# Patient Record
Sex: Female | Born: 1990 | Race: White | Hispanic: No | Marital: Single | Smoking: Never smoker
Health system: Southern US, Community
[De-identification: ages and names within clinical notes are randomized; demographics above are authoritative.]

## PROBLEM LIST (undated history)

## (undated) DIAGNOSIS — F32A Depression, unspecified: Secondary | ICD-10-CM

## (undated) DIAGNOSIS — F329 Major depressive disorder, single episode, unspecified: Secondary | ICD-10-CM

## (undated) DIAGNOSIS — F419 Anxiety disorder, unspecified: Secondary | ICD-10-CM

## (undated) HISTORY — DX: Depression, unspecified: F32.A

## (undated) HISTORY — DX: Anxiety disorder, unspecified: F41.9

## (undated) HISTORY — DX: Major depressive disorder, single episode, unspecified: F32.9

---

## 2014-06-20 ENCOUNTER — Emergency Department: Payer: Self-pay | Admitting: Emergency Medicine

## 2015-06-04 ENCOUNTER — Ambulatory Visit (INDEPENDENT_AMBULATORY_CARE_PROVIDER_SITE_OTHER): Payer: Managed Care, Other (non HMO) | Admitting: Licensed Clinical Social Worker

## 2015-06-04 DIAGNOSIS — F411 Generalized anxiety disorder: Secondary | ICD-10-CM

## 2015-06-04 DIAGNOSIS — F331 Major depressive disorder, recurrent, moderate: Secondary | ICD-10-CM
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
## 2015-06-28 ENCOUNTER — Encounter: Payer: Self-pay | Admitting: Psychiatry

## 2015-06-28 ENCOUNTER — Ambulatory Visit (INDEPENDENT_AMBULATORY_CARE_PROVIDER_SITE_OTHER): Payer: Managed Care, Other (non HMO) | Admitting: Psychiatry

## 2015-06-28 VITALS — BP 110/82 | HR 91 | Temp 98.0°F | Ht 71.0 in | Wt 223.4 lb

## 2015-06-28 DIAGNOSIS — F331 Major depressive disorder, recurrent, moderate: Secondary | ICD-10-CM

## 2015-06-28 DIAGNOSIS — F411 Generalized anxiety disorder: Secondary | ICD-10-CM

## 2015-06-28 MED ORDER — ESCITALOPRAM OXALATE 10 MG PO TABS: ORAL_TABLET | ORAL | Status: DC

## 2015-06-28 MED ORDER — CLONAZEPAM 0.5 MG PO TABS: 0.5000 mg | ORAL_TABLET | Freq: Two times a day (BID) | ORAL | Status: DC | PRN

## 2015-06-28 MED ORDER — TRAZODONE HCL 50 MG PO TABS: 50.0000 mg | ORAL_TABLET | Freq: Every evening | ORAL | Status: DC | PRN

## 2015-06-28 NOTE — Progress Notes (Signed)
Psychiatric Initial Adult Assessment   Patient Identification: Tonya Cooper MRN:  [id number] Date of Evaluation:  06/28/2015 Referral Source: OB/GYN/counselor Chief Complaint:  "I had a lot of trouble with depression in high school." I feel like I "lose control." Chief Complaint    Establish Care; Anxiety; Depression; Stress; Fatigue     Visit Diagnosis:    ICD-9-CM ICD-10-CM   1. Generalized anxiety disorder 300.02 F41.1   2. Moderate episode of recurrent major depressive disorder (HCC) 296.32 F33.1    Diagnosis:  There are no active problems to display for this patient.  History of Present Illness:  Patient indicates that since May 2016 she's had anxiety, feels like she is going to lose control, has difficulty accomplishing things and can't do the things she needs to do at work. She states she also is constantly worried that she is been to do something wrong. For example she states that if a coworker supervisor asked to speak with her she is fearful about what she has done wrong. She states that her mind typically is constantly worried about things. In regards to physical symptoms she states sometimes anxiety disappointed when she has tunnel vision, chest tightness and shaking. She states these physical symptoms can last 2 minutes. States typically her anxieties builds up and then she has the physical symptoms. She states that she sometimes then has guilt about what she has not been able to do or that she has not done something as well.  She relates she's had depression in the past but states that now it's more guilt about not being out to get the things she needs to be done. She states occasionally she does have moments of depression. For example she states she might him home from work in cry for 15 minutes or perhaps an hour. However she states presently she does not feel like her depression is holding her back is much as the anxiety issues discussed above. She states that her most  significant depression was during her senior year of high school in which she states her sleep was poor and she was experiencing sadness. She can't recall other symptoms of depression such as any disturbances in her appetite. She does state that he got to the point where she felt her depression was so painful that she did engage in cutting for about 2 weeks. She states that the cutting was not with the purpose of ending her life but she states that the physical pain she created would distract her from her depression and emotional pain. She states that she ended up seeing a counselor for about 9 months during that senior year but never started any medications for it. She denies suicidal ideation in the past or presently and denies any past suicide attempts.   Elements:  Duration:  As discussed above. Associated Signs/Symptoms: Depression Symptoms:  depressed mood, feelings of worthlessness/guilt, (Hypo) Manic Symptoms:  None Anxiety Symptoms:  Excessive Worry, Psychotic Symptoms:  None PTSD Symptoms: Negative  Past Medical History:  Past Medical History  Diagnosis Date  . Anxiety   . Depression    History reviewed. No pertinent past surgical history. Family History:  Family History  Problem Relation Age of Onset  . Anemia Mother   . Heart attack Mother   . Skin cancer Father    Social History:   Social History   Social History  . Marital Status: Single    Spouse Name: N/A  . Number of Children: N/A  . Years of Education:  N/A   Social History Main Topics  . Smoking status: Never Smoker   . Smokeless tobacco: Never Used  . Alcohol Use: 1.2 - 2.4 oz/week    0 Standard drinks or equivalent, 1-2 Glasses of wine, 1-2 Cans of beer, 0 Shots of liquor per week  . Drug Use: No  . Sexual Activity: Yes    Birth Control/ Protection: Pill, Condom   Other Topics Concern  . None   Social History Narrative  . None   Additional Social History: Patient indicates that her childhood was  good in terms of resources. However she states there was an ongoing theme of feeling her mother was not as caring as patient would've liked her to be. However patient explains that her mother was an executive and corporations in the travel a lot for her job and implied that the demands of the job were part of the reason that her mother was absent a lot of the time. Patient states she struggles with realizing that her mother was doing what she needed for career but states she also felt very bad growing up because she would observe other people with their mothers around them. She states that her father was around but describes them as lacking emotions. She has a younger brother who she described as a difficult person growing up but notes that he is improved after he went to college.  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 She lives with 2 female roommates. She states the roommates are going to move out in December. She states she has mixed emotions because she does look forward to having her privacy but then states that she also does not want to be alone.  Musculoskeletal: Strength & Muscle Tone: within normal limits Gait & Station: normal Patient leans: N/A  Psychiatric Specialty Exam: HPI  Review of Systems  Psychiatric/Behavioral: Positive for depression. Negative for suicidal ideas, hallucinations, memory loss and substance abuse. The patient is nervous/anxious and has insomnia.   All other systems reviewed and are negative.   Blood pressure 110/82, pulse 91, temperature 98 F (36.7 C), temperature source Tympanic, height  (1.803 m), weight 223 lb 6.4 oz (101.334 kg), last menstrual period 05/12/2015, SpO2 99 %.Body mass index is 31.17 kg/(m^2).  General Appearance: Well Groomed  Eye Contact:  Good  Speech:  Normal Rate  Volume:  Normal  Mood:  Anxious  Affect:  anxious, tearful at times when discussing issues  such as her mother or her anxiety symptoms, able to smile that is when discussing an upcoming trip this weekend to [place]  Thought Process:  Linear  Orientation:  Full (Time, Place, and Person)  Thought Content:  Negative  Suicidal Thoughts:  No  Homicidal Thoughts:  No  Memory:  Immediate;   Good Recent;   Good Remote;   Good  Judgement:  Good  Insight:  Good  Psychomotor Activity:  Negative  Concentration:  Good  Recall:  Good  Fund of Knowledge:Good  Language: Good  Akathisia:  Negative  Handed:   AIMS (if indicated):    Assets:  Communication Skills Desire for Improvement Housing Physical Health Vocational/Educational  ADL's:  Intact  Cognition: WNL  Sleep:  poor   Is the patient at risk to self?  No. Has the patient been a risk to self in the past 6 months?  No. Has the patient been a risk to self within the  distant past?  Yes.   cutting for a 2 week period during senior year in high school Is the patient a risk to others?  No. Has the patient been a risk to others in the past 6 months?  No. Has the patient been a risk to others within the distant past?  No.  Allergies:  No Known Allergies Current Medications: Current Outpatient Prescriptions  Medication Sig Dispense Refill  . MINASTRIN 24 FE 1-20 MG-MCG(24) CHEW     . clonazePAM (KLONOPIN) 0.5 MG tablet Take 1 tablet (0.5 mg total) by mouth 2 (two) times daily as needed for anxiety. One in morning and one in afternoon as needed. 60 tablet 1  . escitalopram (LEXAPRO) 10 MG tablet Take one half a tablet in the morning for seven days the increase to one whole tablet in the morning. 30 tablet 1  . traZODone (DESYREL) 50 MG tablet Take 1 tablet (50 mg total) by mouth at bedtime as needed for sleep. 30 tablet 1   No current facility-administered medications for this visit.    Previous Psychotropic Medications: No  Patient has tried some over-the-counter medications for sleep, melatonin, ZzzQuil and  Unisom Substance Abuse History in the last 12 months:  No. Patient states that there was a 2 month period where she drank heavily after the end of her relationship in 2014. Patient does not smoke cigarettes. She states currently her drinking habits are 2 drinks on the weekends and occasionally one glass of wine with dinner. Patient denies any use of illicit drugs. Consequences of Substance Abuse: Negative  Medical Decision Making:  New Problem, with no additional work-up planned (3), Review of Medication Regimen & Side Effects (2) and Review of New Medication or Change in Dosage (2)  Treatment Plan Summary: Medication management and Plan   Generalized anxiety disorder-we will start some Lexapro 5 mg in the morning for 7 days and then she will increase to 10 mg in the morning. Clonazepam 0.5 mg twice a day as needed for anxiety. Risk and benefits of both medications have been discussed patient's able consent.   Major depressive disorder, recurrent moderate-patient reports perhaps some depressed mood that may last hours during the day, some guilt surrounding being able to perform in the midst of anxiety symptoms. She does have a past history of having a major depressive episode. Lexapro as above. It does not appear that depression is the predominant issue at this time is much as the anxiety discussed above.  Insomnia-patient states she's been trying over-the-counter medications consisting of Unisom, ZzzQuil and melatonin. She states she has not received any benefit from those medications yet. She will take trazodone 50 at bedtime as needed for sleep risk and benefits have been discussing patient's able to consent. In regards to risk assessment the patient does have anxiety, depression, age and race as risk factors. She has protective factors of no past suicide attempts, forward thinking, employed, gauge in treatment and gender as protective factors. At this time low risk of imminent harm to self or  others.    Wallace Going 11/11/201611:55 AM

## 2015-07-08 ENCOUNTER — Ambulatory Visit (INDEPENDENT_AMBULATORY_CARE_PROVIDER_SITE_OTHER): Payer: Managed Care, Other (non HMO) | Admitting: Licensed Clinical Social Worker

## 2015-07-08 DIAGNOSIS — F411 Generalized anxiety disorder: Secondary | ICD-10-CM

## 2015-07-16 NOTE — Progress Notes (Signed)
   THERAPIST PROGRESS NOTE  Session Time: 60min  Participation Level: Active  Behavioral Response: Well GroomedAlertAnxious  Type of Therapy: Individual Therapy  Treatment Goals addressed: Coping  Interventions: CBT, Motivational Interviewing, Solution Focused, Strength-based, Supportive, Family Systems and Reframing  Summary: Tonya Cooper is a 24 y.o. female who presents with continued symptoms of her diagnosis.  She is currently struggling with the presence of her uncle whom was adopted by another family when he was a baby.  He has resurfaced and Patient is having anxiety and stress; she wants to feel and be accepted by him.  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  Patient was given coping skills and will utilize during the holiday.   Suicidal/Homicidal: Nowithout intent/plan  Therapist Response: LCSW provided Patient with ongoing emotional support and encouragement.  Normalized her feelings.  Commended Patient on her progress and reinforced the importance of client staying focused on her own strengths and resources and resiliency. Processed various strategies for dealing with stressors.    Plan: Return again in 2 weeks.  Diagnosis: Axis I: Generalized Anxiety Disorder    Axis II: No diagnosis    Marinda Elkicole M Peacock 07/16/2015

## 2015-07-25 ENCOUNTER — Ambulatory Visit (INDEPENDENT_AMBULATORY_CARE_PROVIDER_SITE_OTHER): Payer: Managed Care, Other (non HMO) | Admitting: Psychiatry

## 2015-07-25 ENCOUNTER — Encounter: Payer: Self-pay | Admitting: Psychiatry

## 2015-07-25 VITALS — BP 122/78 | HR 77 | Temp 98.2°F | Ht 71.0 in | Wt 228.4 lb

## 2015-07-25 DIAGNOSIS — F331 Major depressive disorder, recurrent, moderate: Secondary | ICD-10-CM

## 2015-07-25 DIAGNOSIS — F411 Generalized anxiety disorder: Secondary | ICD-10-CM

## 2015-07-25 MED ORDER — ESCITALOPRAM OXALATE 20 MG PO TABS: 20.0000 mg | ORAL_TABLET | Freq: Every day | ORAL | Status: DC

## 2015-07-25 NOTE — Progress Notes (Signed)
BH MD/PA/NP OP Progress Note  07/25/2015 4:14 PM Tonya Cooper  MRN:  [id number]  Subjective:  Patient returns follow-up of her generalized anxiety disorder, major depressive disorder, recurrent moderate and insomnia. She states she feels like she's noticed some improvement in her anxiety. She states that in the past where she feels like things would get to her to the point where she could not take it she feels like she is more tolerant of issues that he is to medications would've made her extremely anxious. She is sleeping better with the trazodone. She overall states that she feels something is helping her be more tolerant. She states she can't say that she is overwhelmingly change with the medications but does feel they've been of some benefit.  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 She's plans to go home to be with her parents and 49 year old brother in [place]. Will attend her fifth year high school reunion. She states that she is somewhat worried about how she'll interact with her father because in the past they've typically had arguments. Chief Complaint: maybe better Chief Complaint    Follow-up; Medication Refill     Visit Diagnosis:     ICD-9-CM ICD-10-CM   1. Generalized anxiety disorder 300.02 F41.1   2. Moderate episode of recurrent major depressive disorder (HCC) 296.32 F33.1     Past Medical History:  Past Medical History  Diagnosis Date  . Anxiety   . Depression    History reviewed. No pertinent past surgical history. Family History:  Family History  Problem Relation Age of Onset  . Anemia Mother   . Heart attack Mother   . Skin cancer Father    Social History:  Social History   Social History  . Marital Status: Single    Spouse Name: N/A  . Number of Children: N/A  . Years of Education: N/A   Social History Main Topics  . Smoking status: Never Smoker   . Smokeless tobacco: Never Used  . Alcohol Use: 1.2  - 2.4 oz/week    0 Standard drinks or equivalent, 1-2 Glasses of wine, 1-2 Cans of beer, 0 Shots of liquor per week  . Drug Use: No  . Sexual Activity: Yes    Birth Control/ Protection: Pill, Condom   Other Topics Concern  . None   Social History Narrative   Additional History:   Assessment:   Musculoskeletal: Strength & Muscle Tone: within normal limits Gait & Station: normal Patient leans: N/A  Psychiatric Specialty Exam: HPI  Review of Systems  Psychiatric/Behavioral: Negative for depression, suicidal ideas, hallucinations, memory loss and substance abuse. The patient is nervous/anxious (states that it is decreased but still there). The patient does not have insomnia.     Blood pressure 122/78, pulse 77, temperature 98.2 F (36.8 C), temperature source Tympanic, height  (1.803 m), weight 228 lb 6.4 oz (103.602 kg), last menstrual period 07/11/2015, SpO2 99 %.Body mass index is 31.87 kg/(m^2).  General Appearance: Neat and Well Groomed  Eye Contact:  Good  Speech:  Normal Rate  Volume:  Normal  Mood:  Good  Affect:  bright, smiling  Thought Process:  Linear and Logical  Orientation:  Full (Time, Place, and Person)  Thought Content:  Negative  Suicidal Thoughts:  No  Homicidal Thoughts:  No  Memory:  Immediate;   Good Recent;   Good Remote;   Good  Judgement:  Good  Insight:  Good  Psychomotor  Activity:  Negative  Concentration:  Good  Recall:  Good  Fund of Knowledge: Good  Language: Good  Akathisia:  Negative  Handed:    AIMS (if indicated):    Assets:  Communication Skills Desire for Improvement Vocational/Educational  ADL's:  Intact  Cognition: WNL  Sleep:  Good with trazodone   Is the patient at risk to self?  No. Has the patient been a risk to self in the past 6 months?  No. Has the patient been a risk to self within the distant past?  No. Is the patient a risk to others?  No. Has the patient been a risk to others in the past 6 months?   No. Has the patient been a risk to others within the distant past?  No.  Current Medications: Current Outpatient Prescriptions  Medication Sig Dispense Refill  . clonazePAM (KLONOPIN) 0.5 MG tablet Take 1 tablet (0.5 mg total) by mouth 2 (two) times daily as needed for anxiety. One in morning and one in afternoon as needed. 60 tablet 1  . escitalopram (LEXAPRO) 20 MG tablet Take 1 tablet (20 mg total) by mouth daily. 30 tablet 2  . MINASTRIN 24 FE 1-20 MG-MCG(24) CHEW     . traZODone (DESYREL) 50 MG tablet Take 1 tablet (50 mg total) by mouth at bedtime as needed for sleep. 30 tablet 1   No current facility-administered medications for this visit.    Medical Decision Making:  Established Problem, Stable/Improving (1), Review of Medication Regimen & Side Effects (2) and Review of New Medication or Change in Dosage (2)  Treatment Plan Summary:Medication management and Plan. Asian style side effects from her medications.  Generalized anxiety disorder-patient reports decreased anxiety on the Lexapro but still states that it is  present. We will increase it from 10 mg to 20 mg daily. She has Klonopin 0.5 mg twice a day as needed although she states she is typically only taking it once a day.  Major depressive disorder, recurrent moderate-this has not been a predominant issue at this time and can be addressed with Lexapro as above. Patient is not manifesting behaviors or symptoms consistent with being in a major depressive episode at this time.  Insomnia-responding well to trazodone 50 Milligan's at bedtime.  Insomnia-07/25/2015, 4:14 PM

## 2015-08-05 ENCOUNTER — Ambulatory Visit (INDEPENDENT_AMBULATORY_CARE_PROVIDER_SITE_OTHER): Payer: Managed Care, Other (non HMO) | Admitting: Licensed Clinical Social Worker

## 2015-08-05 DIAGNOSIS — F411 Generalized anxiety disorder: Secondary | ICD-10-CM

## 2015-08-06 NOTE — Progress Notes (Signed)
   THERAPIST PROGRESS NOTE  Session Time: 60min  Participation Level: Active  Behavioral Response: Casual and NeatAlertAnxious  Type of Therapy: Individual Therapy  Treatment Goals addressed: Coping  Interventions: CBT, Motivational Interviewing, Solution Focused, Strength-based, Supportive, Family Systems and Reframing  Summary: [name] is a 24 y.o. female who presents with continued symptoms of her diagnosis.  She reports that she recently learned about her adopted uncle and has mixed feelings about calling him "Judi SaaUncle Mike" since she has grown up with her mother's younger brother Casimiro NeedleMichael.  She reports having a poor relationship with her family.  "He's their if I need him, but not emotionally." Reports that she isolated herself during Thanksgiving due to the abundance of family members at her Grandmother's home.  States that she is currently worried about her boyfriend; his father died a few days prior to Thanksgiving.  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  Reports using coping skills previously taught.  Suicidal/Homicidal: Nowithout intent/plan  Therapist Response: LCSW provided Patient with ongoing emotional support and encouragement.  Normalized her feelings.  Commended Patient on her progress and reinforced the importance of client staying focused on her own strengths and resources and resiliency. Processed various strategies for dealing with stressors.    Plan: Return again in 2 weeks.  Diagnosis: Axis I: Generalized Anxiety Disorder    Axis II: No diagnosis    Marinda Elkicole M Peacock 08/06/2015

## 2015-08-26 ENCOUNTER — Ambulatory Visit: Payer: Managed Care, Other (non HMO) | Admitting: Psychiatry

## 2015-09-04 ENCOUNTER — Other Ambulatory Visit: Payer: Self-pay

## 2015-09-04 MED ORDER — TRAZODONE HCL 50 MG PO TABS: 50.0000 mg | ORAL_TABLET | Freq: Every evening | ORAL | Status: DC | PRN

## 2015-09-04 NOTE — Telephone Encounter (Signed)
request for refill on trazodone  , pt made appt today for 09-10-15 to see you.

## 2015-09-05 ENCOUNTER — Ambulatory Visit: Payer: Self-pay | Admitting: Licensed Clinical Social Worker

## 2015-09-05 ENCOUNTER — Ambulatory Visit (INDEPENDENT_AMBULATORY_CARE_PROVIDER_SITE_OTHER): Payer: Managed Care, Other (non HMO) | Admitting: Licensed Clinical Social Worker

## 2015-09-05 DIAGNOSIS — F411 Generalized anxiety disorder: Secondary | ICD-10-CM

## 2015-09-10 ENCOUNTER — Encounter: Payer: Self-pay | Admitting: Psychiatry

## 2015-09-10 ENCOUNTER — Ambulatory Visit (INDEPENDENT_AMBULATORY_CARE_PROVIDER_SITE_OTHER): Payer: Managed Care, Other (non HMO) | Admitting: Psychiatry

## 2015-09-10 VITALS — BP 122/78 | HR 109 | Temp 98.2°F | Ht 71.0 in | Wt 237.4 lb

## 2015-09-10 DIAGNOSIS — F331 Major depressive disorder, recurrent, moderate: Secondary | ICD-10-CM

## 2015-09-10 DIAGNOSIS — F411 Generalized anxiety disorder: Secondary | ICD-10-CM

## 2015-09-10 MED ORDER — ESCITALOPRAM OXALATE 20 MG PO TABS
20.0000 mg | ORAL_TABLET | Freq: Every day | ORAL | Status: DC
Start: 2015-09-10 — End: 2016-05-04

## 2015-09-10 MED ORDER — CLONAZEPAM 0.5 MG PO TABS: 0.5000 mg | ORAL_TABLET | Freq: Two times a day (BID) | ORAL | Status: DC | PRN

## 2015-09-10 MED ORDER — TRAZODONE HCL 50 MG PO TABS: 50.0000 mg | ORAL_TABLET | Freq: Every evening | ORAL | Status: DC | PRN

## 2015-09-10 NOTE — Progress Notes (Signed)
BH MD/PA/NP OP Progress Note  09/10/2015 3:36 PM Tonya Cooper  MRN:  [id number]  Subjective:  Patient returns follow-up of her generalized anxiety disorder, major depressive disorder, recurrent moderate and insomnia. She indicates that overall she feels pretty good. She states she should notice a pattern where about 3 days each month she becomes down and depressed and then gradually returns. At the last visit we did increase her Lexapro from 10 mg a day to 20 mg a day. Patient states she didn't notice any substantial difference however she states that she had run out of medication for 2 days and she did notice when she started taking it again that she could feel back in her system but she's not able to give a particular description as to the change in the feelings that she had when she was without the medicine.  She continues to teach. She states that she uses her clonazepam once a day. She states she's sleeping well on the trazodone.  Chief Complaint: "Cycles" depression Chief Complaint    Follow-up; Medication Refill     Visit Diagnosis:     ICD-9-CM ICD-10-CM   1. Generalized anxiety disorder 300.02 F41.1   2. Moderate episode of recurrent major depressive disorder (HCC) 296.32 F33.1     Past Medical History:  Past Medical History  Diagnosis Date  . Anxiety   . Depression    History reviewed. No pertinent past surgical history. Family History:  Family History  Problem Relation Age of Onset  . Anemia Mother   . Heart attack Mother   . Skin cancer Father    Social History:  Social History   Social History  . Marital Status: Single    Spouse Name: N/A  . Number of Children: N/A  . Years of Education: N/A   Social History Main Topics  . Smoking status: Never Smoker   . Smokeless tobacco: Never Used  . Alcohol Use: 1.2 - 2.4 oz/week    0 Standard drinks or equivalent, 1-2 Glasses of wine, 1-2 Cans of beer, 0 Shots of liquor per week  . Drug Use: No  . Sexual Activity:  Yes    Birth Control/ Protection: Pill, Condom   Other Topics Concern  . None   Social History Narrative   Additional History:   Assessment:   Musculoskeletal: Strength & Muscle Tone: within normal limits Gait & Station: normal Patient leans: N/A  Psychiatric Specialty Exam: HPI  Review of Systems  Psychiatric/Behavioral: Positive for depression (State she has 3 day periods each month where she feels her mood becomes depressed.). Negative for suicidal ideas, hallucinations, memory loss and substance abuse. The patient is not nervous/anxious and does not have insomnia.   All other systems reviewed and are negative.   Blood pressure 122/78, pulse 109, temperature 98.2 F (36.8 C), temperature source Tympanic, height  (1.803 m), weight 237 lb 6.4 oz (107.684 kg), last menstrual period 07/11/2015, SpO2 96 %.Body mass index is 33.13 kg/(m^2).  General Appearance: Neat and Well Groomed  Eye Contact:  Good  Speech:  Normal Rate  Volume:  Normal  Mood:  Good  Affect:  bright, smiling  Thought Process:  Linear and Logical  Orientation:  Full (Time, Place, and Person)  Thought Content:  Negative  Suicidal Thoughts:  No  Homicidal Thoughts:  No  Memory:  Immediate;   Good Recent;   Good Remote;   Good  Judgement:  Good  Insight:  Good  Psychomotor Activity:  Negative  Concentration:  Good  Recall:  Good  Fund of Knowledge: Good  Language: Good  Akathisia:  Negative  Handed:    AIMS (if indicated):    Assets:  Communication Skills Desire for Improvement Vocational/Educational  ADL's:  Intact  Cognition: WNL  Sleep:  Good with trazodone   Is the patient at risk to self?  No. Has the patient been a risk to self in the past 6 months?  No. Has the patient been a risk to self within the distant past?  No. Is the patient a risk to others?  No. Has the patient been a risk to others in the past 6 months?  No. Has the patient been a risk to others within the distant  past?  No.  Current Medications: Current Outpatient Prescriptions  Medication Sig Dispense Refill  . clonazePAM (KLONOPIN) 0.5 MG tablet Take 1 tablet (0.5 mg total) by mouth 2 (two) times daily as needed for anxiety. One in morning and one in afternoon as needed. 60 tablet 4  . escitalopram (LEXAPRO) 20 MG tablet Take 1 tablet (20 mg total) by mouth daily. 30 tablet 4  . MINASTRIN 24 FE 1-20 MG-MCG(24) CHEW     . traZODone (DESYREL) 50 MG tablet Take 1 tablet (50 mg total) by mouth at bedtime as needed for sleep. 30 tablet 4   No current facility-administered medications for this visit.    Medical Decision Making:  Established Problem, Stable/Improving (1), Review of Medication Regimen & Side Effects (2) and Review of New Medication or Change in Dosage (2)  Treatment Plan Summary:Medication management and Plan.   Generalized anxiety disorder-under control with Lexapro 20 mg daily. She has Klonopin 0.5 mg twice a day as needed although she states she is typically only taking it once a day.  Major depressive disorder, recurrent moderate-she describes 3 day periods each month where she might feel depressed. She states that it's not intense she does not feel it's interfering with her ability to function but we discussed how if it becomes worse or for longer periods she might need to consider adjusting her medication. However at this time patient is comfortable with her medications where they are.  Insomnia-responding well to trazodone 50 mg at bedtime.  She is aware of my departure from the clinic and that she'll follow with a new provider within this clinic in 3 months. She's been encouraged call any questions or concerns prior to her next appointment.  Insomnia-09/10/2015, 3:36 PM

## 2015-09-11 NOTE — Progress Notes (Signed)
   THERAPIST PROGRESS NOTE  Session Time:  Participation Level: Active  Behavioral Response: CasualAlertAnxious  Type of Therapy: Individual Therapy  Treatment Goals addressed: Coping  Interventions: CBT, Motivational Interviewing, Solution Focused, Supportive and Reframing  Summary: Tonya Cooper is a 25 y.o. female who presents with continued symptoms of her diagnosis.  She explored current stressors and how she has been coping with them.  She reports that her boyfriend's father passed away about a month ago but the funeral was this past weekend.  She reports being occupied with ensuring that he is doing well emotionally that she has not been able to work on her anxiety.  She denies having difficulty with her roommate moving out of the home and reports a continued relationship with her.   She was able to list self soothing activities that she uses daily for self care.   Suicidal/Homicidal: Nowithout intent/plan  Therapist Response: LCSW provided Patient with ongoing emotional support and encouragement.  Normalized her feelings.  Commended Patient on her progress and reinforced the importance of client staying focused on her own strengths and resources and resiliency. Processed various strategies for dealing with stressors.    Plan: Return again in 4 weeks.  Diagnosis: Axis I: Generalized Anxiety Disorder    Axis II: No diagnosis    Marinda Elk 09/11/2015

## 2015-09-24 ENCOUNTER — Ambulatory Visit: Payer: Managed Care, Other (non HMO) | Admitting: Licensed Clinical Social Worker

## 2015-10-01 ENCOUNTER — Ambulatory Visit (INDEPENDENT_AMBULATORY_CARE_PROVIDER_SITE_OTHER): Payer: Managed Care, Other (non HMO) | Admitting: Licensed Clinical Social Worker

## 2015-10-01 DIAGNOSIS — F411 Generalized anxiety disorder: Secondary | ICD-10-CM

## 2015-10-09 NOTE — Progress Notes (Signed)
   THERAPIST PROGRESS NOTE  Session Time:  Participation Level: Active  Behavioral Response: NeatAlertAnxious  Type of Therapy: Individual Therapy  Treatment Goals addressed: Coping  Interventions: CBT, Motivational Interviewing, Solution Focused, Strength-based, Supportive, Family Systems and Reframing  Summary: Tonya Cooper is a 25 y.o. female who presents with continued symptoms of her diagnosis.  Therapist performed a brief mood check assessing happiness, sadness, excitement, anger, disgust, and fear.  Therapist provided active listening for Patient as she explained details of her week including current stressors and worries.  Therapist reviewed last session with Patient. Therapist and Patient continued with discussion about avoiding triggers to anxiety.  Therapist requested that Patient be mindful of the things that cause her to become anxious over the next week.  Therapist encouraged Patient to utilize a journal. Therapist educated Patient on the positive effects of a journal. Therapist requested that Patient note her reaction to the trigger as well. Discussion of Therapist going on Maternity Leave and provided patient with a list of therapist in the area.   Suicidal/Homicidal: Nowithout intent/plan  Therapist Response: LCSW provided Patient with ongoing emotional support and encouragement.  Normalized her feelings.  Commended Patient on her progress and reinforced the importance of client staying focused on her own strengths and resources and resiliency. Processed various strategies for dealing with stressors.    Plan: Discussion of Therapist going on Maternity Leave and provided patient with a list of therapist in the area.  Diagnosis: Axis I: Generalized Anxiety Disorder    Axis II: No diagnosis    Marinda Elk 10/04/2015
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
## 2016-05-04 ENCOUNTER — Encounter: Payer: Self-pay | Admitting: Psychiatry

## 2016-05-04 ENCOUNTER — Ambulatory Visit (INDEPENDENT_AMBULATORY_CARE_PROVIDER_SITE_OTHER): Payer: Managed Care, Other (non HMO) | Admitting: Psychiatry

## 2016-05-04 VITALS — BP 127/76 | HR 67 | Ht 71.0 in | Wt 261.8 lb

## 2016-05-04 DIAGNOSIS — F331 Major depressive disorder, recurrent, moderate: Secondary | ICD-10-CM

## 2016-05-04 DIAGNOSIS — F411 Generalized anxiety disorder: Secondary | ICD-10-CM

## 2016-05-04 MED ORDER — VENLAFAXINE HCL ER 75 MG PO CP24: 75.0000 mg | ORAL_CAPSULE | Freq: Every day | ORAL | 1 refills | Status: DC

## 2016-05-04 MED ORDER — TRAZODONE HCL 50 MG PO TABS: 50.0000 mg | ORAL_TABLET | Freq: Every evening | ORAL | 4 refills | Status: DC | PRN

## 2016-05-04 NOTE — Progress Notes (Signed)
Psychiatric Initial Adult Assessment   Patient Identification: Tonya Cooper MRN:  [id number] Date of Evaluation:  05/04/2016 Referral Source: Previous Pt of Dr Mayford Knife  Chief Complaint:   Chief Complaint    Establish Care     Visit Diagnosis:    ICD-9-CM ICD-10-CM   1. Moderate episode of recurrent major depressive disorder (HCC) 296.32 F33.1   2. Generalized anxiety disorder 300.02 F41.1     History of Present Illness:    Patient is a 25 year old  female who was previously following Dr. Mayford Knife presented for initial assessment. She was last seen in January. She is currently a Runner, broadcasting/film/video in high school and reported that she has been running out of her medications. Patient reported that she wants the medications to be refilled. She reported that she is planning to start the graduate school as she feels that her work is becoming overwhelming and she will no longer be able to continue her work in the high school. She is planning to complete this school year and then go for further studies. She is interested in becoming a psychologist. She reported that she currently lives by herself. She feels depressed hopeless helpless at times. She has been able to sleep well with the help of trazodone.  She reported that she was taking Klonopin on when necessary basis and has not taken more than 1-2 pills. She feels that the Lexapro is not helping. She is interested in trying some other medication as she has been gaining a lot of weight on Lexapro. She also has increased bleeding during her menstrual periods  she has been taking monthly hormones but they are not able to control her cycle. She is going to have her appointment on Friday.  Patient reported that she has never tried any other medication besides the Lexapro. She is open to suggestions. She reported that she is not sleeping well at night. She currently denied using any drugs or alcohol at this time.    Associated Signs/Symptoms: Depression  Symptoms:  depressed mood, insomnia, fatigue, feelings of worthlessness/guilt, hopelessness, anxiety, loss of energy/fatigue, disturbed sleep, weight gain, increased appetite, (Hypo) Manic Symptoms:  Flight of Ideas, Impulsivity, Labiality of Mood, Anxiety Symptoms:  Excessive Worry, Psychotic Symptoms:  none PTSD Symptoms: Negative NA  Past Psychiatric History: h/o depression and anxiety No h/o suicide attempts or hospitalization   Previous Psychotropic Medications:  Trazodone, Klonopin and Lexapro   Substance Abuse History in the last 12 months:  Yes.    Occasionally    Consequences of Substance Abuse: Negative NA  Past Medical History:  Past Medical History:  Diagnosis Date  . Anxiety   . Depression    History reviewed. No pertinent surgical history.  Family Psychiatric History:  Anxiety - father  Family History:  Family History  Problem Relation Age of Onset  . Anemia Mother   . Heart attack Mother   . Skin cancer Father     Social History:   Social History   Social History  . Marital status: Single    Spouse name: N/A  . Number of children: N/A  . Years of education: N/A   Social History Main Topics  . Smoking status: Never Smoker  . Smokeless tobacco: Never Used  . Alcohol use 1.2 - 2.4 oz/week    1 - 2 Glasses of wine, 1 - 2 Cans of beer per week  . Drug use: No  . Sexual activity: Yes    Birth control/ protection: Pill, Condom   Other Topics  Concern  . None   Social History Narrative  . None    Additional Social History:  Planning to do Grad school next year  Currently teaching in school - three years.  Her father is upset since she came out and he is not  acceptable of her behavior. Has poor relationship with father - Has good relationship with mother and her relationship is improving with her brother. She stated that she has a relationship and her girlfriend travels a lot.       Allergies:  No Known  Allergies  Metabolic Disorder Labs: No results found for: HGBA1C, MPG No results found for: PROLACTIN No results found for: CHOL, TRIG, HDL, CHOLHDL, VLDL, LDLCALC   Current Medications: Current Outpatient Prescriptions  Medication Sig Dispense Refill  . clonazePAM (KLONOPIN) 0.5 MG tablet Take 1 tablet (0.5 mg total) by mouth 2 (two) times daily as needed for anxiety. One in morning and one in afternoon as needed. 60 tablet 4  . escitalopram (LEXAPRO) 20 MG tablet Take 1 tablet (20 mg total) by mouth daily. 30 tablet 4  . MINASTRIN 24 FE 1-20 MG-MCG(24) CHEW     . traZODone (DESYREL) 50 MG tablet Take 1 tablet (50 mg total) by mouth at bedtime as needed for sleep. 30 tablet 4   No current facility-administered medications for this visit.     Neurologic: Headache: No Seizure: No Paresthesias:No  Musculoskeletal: Strength & Muscle Tone: within normal limits Gait & Station: normal Patient leans: N/A  Psychiatric Specialty Exam: ROS  Blood pressure 127/76, pulse 67, height 5\' 11"  (1.803 m), weight 261 lb 12.8 oz (118.8 kg), last menstrual period 04/20/2016.Body mass index is 36.51 kg/m.  General Appearance: Casual and Well Groomed  Eye Contact:  Fair  Speech:  Pressured  Volume:  Normal  Mood:  Anxious  Affect:  Congruent  Thought Process:  Coherent  Orientation:  Full (Time, Place, and Person)  Thought Content:  WDL  Suicidal Thoughts:  No  Homicidal Thoughts:  No  Memory:  Immediate;   Fair Recent;   Fair Remote;   Fair  Judgement:  Fair  Insight:  Fair  Psychomotor Activity:  Normal  Concentration:  Concentration: Fair and Attention Span: Fair  Recall:  [employer]  Fund of Knowledge:Fair  Language: Fair  Akathisia:  No  Handed:  Right  AIMS (if indicated):    Assets:  Communication Skills Desire for Improvement Physical Health Social Support Talents/Skills  ADL's:  Intact  Cognition: WNL  Sleep:  Poor     Treatment Plan Summary: Medication management    Discussed with patient what the medication options treatment risks benefits and alternatives.  I will start her on Effexor XR 75 mg in the morning. She will continue on trazodone 50 mg by mouth daily at bedtime She will continue with therapy appointments  Follow-up in 3 weeks or earlier depending on her symptoms   More than 50% of the time spent in psychoeducation, counseling and coordination of care.    This note was generated in part or whole with voice recognition software. Voice regonition is usually quite accurate but there are transcription errors that can and very often do occur. I apologize for any typographical errors that were not detected and corrected.    Brandy HaleUzma Kamaiyah Uselton, MD 9/18/20173:02 PM

## 2016-05-26 ENCOUNTER — Ambulatory Visit (INDEPENDENT_AMBULATORY_CARE_PROVIDER_SITE_OTHER): Payer: Managed Care, Other (non HMO) | Admitting: Psychiatry

## 2016-05-26 ENCOUNTER — Encounter: Payer: Self-pay | Admitting: Psychiatry

## 2016-05-26 VITALS — BP 126/83 | HR 74 | Temp 98.5°F | Wt 261.6 lb

## 2016-05-26 DIAGNOSIS — F411 Generalized anxiety disorder: Secondary | ICD-10-CM | POA: Diagnosis not present

## 2016-05-26 DIAGNOSIS — F331 Major depressive disorder, recurrent, moderate: Secondary | ICD-10-CM | POA: Diagnosis not present

## 2016-05-26 MED ORDER — VENLAFAXINE HCL ER 75 MG PO CP24: 75.0000 mg | ORAL_CAPSULE | Freq: Every day | ORAL | 1 refills | Status: DC

## 2016-05-26 MED ORDER — TRAZODONE HCL 50 MG PO TABS: 50.0000 mg | ORAL_TABLET | Freq: Every evening | ORAL | 4 refills | Status: DC | PRN

## 2016-05-26 NOTE — Progress Notes (Signed)
Psychiatric MD Progress NOTE  Patient Identification: Tonya Cooper MRN:  [id number] Date of Evaluation:  05/26/2016 Referral Source: Previous Pt of Dr Mayford KnifeWilliams  Chief Complaint:   Chief Complaint    Medication Refill; Follow-up     Visit Diagnosis:    ICD-9-CM ICD-10-CM   1. Moderate episode of recurrent major depressive disorder (HCC) 296.32 F33.1   2. Generalized anxiety disorder 300.02 F41.1     History of Present Illness:    Patient is a 25 year old  female who was previously following Dr. Mayford KnifeWilliams presented for Follow-up. She was last seen in January. 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 She reported that she has started taking her medications. She missed the dose of Effexor for 1 day and started having withdrawal symptoms. She reported that she is currently experiencing headaches for the past 2 days. She is going to take care of it. She continues to appear hyper during the interview and was talking in detail about the events going at school. We discussed about her medications. She reported that she is responding well to the medication and does not want to change the medications at this time.   She has been sleeping well with the help of trazodone. She currently denied having any suicidal ideations or plans. She denied having any perceptual disturbances.      Associated Signs/Symptoms: Depression Symptoms:  depressed mood, hopelessness, anxiety, loss of energy/fatigue, disturbed sleep, weight gain, increased appetite, (Hypo) Manic Symptoms:  Flight of Ideas, Impulsivity, Labiality of Mood, Anxiety Symptoms:  Excessive Worry, Psychotic Symptoms:  none PTSD Symptoms: Negative NA  Past Psychiatric History: h/o depression and anxiety No h/o suicide attempts or hospitalization   Previous Psychotropic Medications:  Trazodone, Klonopin and Lexapro   Substance Abuse History in the last 12 months:   Yes.    Occasionally    Consequences of Substance Abuse: Negative NA  Past Medical History:  Past Medical History:  Diagnosis Date  . Anxiety   . Depression    History reviewed. No pertinent surgical history.  Family Psychiatric History:  Anxiety - father  Family History:  Family History  Problem Relation Age of Onset  . Anemia Mother   . Heart attack Mother   . Skin cancer Father     Social History:   Social History   Social History  . Marital status: Single    Spouse name: N/A  . Number of children: N/A  . Years of education: N/A   Social History Main Topics  . Smoking status: Never Smoker  . Smokeless tobacco: Never Used  . Alcohol use 1.2 - 2.4 oz/week    1 - 2 Glasses of wine, 1 - 2 Cans of beer per week  . Drug use: No  . Sexual activity: Yes    Birth control/ protection: Pill, Condom   Other Topics Concern  . None   Social History Narrative  . None    Additional Social History:  Planning to do Grad school next year  Currently teaching in school - three years.  Her father is upset since she came out and he is not  acceptable of her behavior. Has poor relationship with father - Has good relationship with mother and her relationship is improving with her brother. She stated that she has a relationship and her girlfriend travels a lot.       Allergies:  No Known Allergies  Metabolic Disorder Labs: No results found  for: HGBA1C, MPG No results found for: PROLACTIN No results found for: CHOL, TRIG, HDL, CHOLHDL, VLDL, LDLCALC   Current Medications: Current Outpatient Prescriptions  Medication Sig Dispense Refill  . MINASTRIN 24 FE 1-20 MG-MCG(24) CHEW     . traZODone (DESYREL) 50 MG tablet Take 1 tablet (50 mg total) by mouth at bedtime as needed for sleep. 30 tablet 4  . venlafaxine XR (EFFEXOR XR) 75 MG 24 hr capsule Take 1 capsule (75 mg total) by mouth daily with breakfast. 30 capsule 1   No current facility-administered medications  for this visit.     Neurologic: Headache: No Seizure: No Paresthesias:No  Musculoskeletal: Strength & Muscle Tone: within normal limits Gait & Station: normal Patient leans: N/A  Psychiatric Specialty Exam: ROS  Blood pressure 126/83, pulse 74, temperature 98.5 F (36.9 C), temperature source Oral, weight 261 lb 9.6 oz (118.7 kg), last menstrual period 04/20/2016.Body mass index is 36.49 kg/m.  General Appearance: Casual and Well Groomed  Eye Contact:  Fair  Speech:  Pressured  Volume:  Normal  Mood:  Anxious  Affect:  Congruent  Thought Process:  Coherent  Orientation:  Full (Time, Place, and Person)  Thought Content:  WDL  Suicidal Thoughts:  No  Homicidal Thoughts:  No  Memory:  Immediate;   Fair Recent;   Fair Remote;   Fair  Judgement:  Fair  Insight:  Fair  Psychomotor Activity:  Normal  Concentration:  Concentration: Fair and Attention Span: Fair  Recall:  [employer] of Knowledge:Fair  Language: Fair  Akathisia:  No  Handed:  Right  AIMS (if indicated):    Assets:  Communication Skills Desire for Improvement Physical Health Social Support Talents/Skills  ADL's:  Intact  Cognition: WNL  Sleep:  Poor     Treatment Plan Summary: Medication management   Discussed with patient what the medication options treatment risks benefits and alternatives.  Continue  Effexor XR 75 mg in the morning. Continue  trazodone 50 mg by mouth daily at bedtime She will continue with therapy appointments  Follow-up in 4 weeks or earlier depending on her symptoms   More than 50% of the time spent in psychoeducation, counseling and coordination of care.    This note was generated in part or whole with voice recognition software. Voice regonition is usually quite accurate but there are transcription errors that can and very often do occur. I apologize for any typographical errors that were not detected and corrected.    Brandy Hale, MD 10/10/20174:05 PM

## 2016-06-25 ENCOUNTER — Ambulatory Visit (INDEPENDENT_AMBULATORY_CARE_PROVIDER_SITE_OTHER): Payer: Managed Care, Other (non HMO) | Admitting: Psychiatry

## 2016-06-25 ENCOUNTER — Encounter: Payer: Self-pay | Admitting: Psychiatry

## 2016-06-25 VITALS — BP 120/82 | HR 79 | Ht 71.0 in | Wt 264.2 lb

## 2016-06-25 DIAGNOSIS — F411 Generalized anxiety disorder: Secondary | ICD-10-CM

## 2016-06-25 DIAGNOSIS — F331 Major depressive disorder, recurrent, moderate: Secondary | ICD-10-CM | POA: Diagnosis not present

## 2016-06-25 MED ORDER — DULOXETINE HCL 30 MG PO CPEP: 30.0000 mg | ORAL_CAPSULE | Freq: Every day | ORAL | 3 refills | Status: DC

## 2016-06-25 NOTE — Progress Notes (Signed)
Psychiatric MD Progress NOTE  Patient Identification: Tonya MoreSarah Cooper MRN:  [id number] Date of Evaluation:  06/25/2016 Referral Source: Previous Pt of Dr Mayford KnifeWilliams  Chief Complaint:   Chief Complaint    Follow-up     Visit Diagnosis:    ICD-9-CM ICD-10-CM   1. Moderate episode of recurrent major depressive disorder (HCC) 296.32 F33.1   2. Generalized anxiety disorder 300.02 F41.1     History of Present Illness:    Patient is a 25 year old  female With history of depression presented for follow-up. She reported that she is currently experiencing headache since she was started on the Effexor. She reported that the headache usually starts around noontime. She feels that it has been ongoing and does not resolving. She feels that the headache is uncomfortable and she cannot continue working on a daily basis. 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 She has been compliant with her medications and has been taking trazodone at bedtime to help her sleep.   Discussed about different medications and she agreed to a trial of Cymbalta at this time.   . She currently denied having any suicidal ideations or plans. She denied having any perceptual disturbances.      Associated Signs/Symptoms: Depression Symptoms:  psychomotor agitation, fatigue, anxiety, loss of energy/fatigue, disturbed sleep, weight gain, increased appetite, (Hypo) Manic Symptoms:  Flight of Ideas, Impulsivity, Labiality of Mood, Anxiety Symptoms:  Excessive Worry, Psychotic Symptoms:  none PTSD Symptoms: Negative NA  Past Psychiatric History: h/o depression and anxiety No h/o suicide attempts or hospitalization   Previous Psychotropic Medications:  Trazodone, Klonopin and Lexapro   Substance Abuse History in the last 12 months:  Yes.    Occasionally    Consequences of Substance  Abuse: Negative NA  Past Medical History:  Past Medical History:  Diagnosis Date  . Anxiety   . Depression    No past surgical history on file.  Family Psychiatric History:  Anxiety - father  Family History:  Family History  Problem Relation Age of Onset  . Anemia Mother   . Heart attack Mother   . Skin cancer Father     Social History:   Social History   Social History  . Marital status: Single    Spouse name: N/A  . Number of children: N/A  . Years of education: N/A   Social History Main Topics  . Smoking status: Never Smoker  . Smokeless tobacco: Never Used  . Alcohol use 1.2 oz/week    1 Glasses of wine, 1 Cans of beer per week  . Drug use: No  . Sexual activity: Yes    Partners: Male    Birth control/ protection: Pill, Condom, IUD   Other Topics Concern  . None   Social History Narrative  . None    Additional Social History:  Planning to do Grad school next year  Currently teaching in school - three years.  Her father is upset since she came out and he is not  acceptable of her behavior. Has poor relationship with father - Has good relationship with mother and her relationship is improving with her brother. She stated that she has a relationship and her girlfriend travels a lot.       Allergies:  No Known Allergies  Metabolic Disorder Labs: No results found for: HGBA1C, MPG No results found for: PROLACTIN No results found for:  CHOL, TRIG, HDL, CHOLHDL, VLDL, LDLCALC   Current Medications: Current Outpatient Prescriptions  Medication Sig Dispense Refill  . traZODone (DESYREL) 50 MG tablet Take 1 tablet (50 mg total) by mouth at bedtime as needed for sleep. 30 tablet 4  . venlafaxine XR (EFFEXOR XR) 75 MG 24 hr capsule Take 1 capsule (75 mg total) by mouth daily with breakfast. 30 capsule 1   No current facility-administered medications for this visit.     Neurologic: Headache: No Seizure:  No Paresthesias:No  Musculoskeletal: Strength & Muscle Tone: within normal limits Gait & Station: normal Patient leans: N/A  Psychiatric Specialty Exam: ROS  Blood pressure 120/82, pulse 79, height 5\' 11"  (1.803 m), weight 264 lb 3.2 oz (119.8 kg).Body mass index is 36.85 kg/m.  General Appearance: Casual and Well Groomed  Eye Contact:  Fair  Speech:  Pressured  Volume:  Normal  Mood:  Anxious  Affect:  Congruent  Thought Process:  Coherent  Orientation:  Full (Time, Place, and Person)  Thought Content:  WDL  Suicidal Thoughts:  No  Homicidal Thoughts:  No  Memory:  Immediate;   Fair Recent;   Fair Remote;   Fair  Judgement:  Fair  Insight:  Fair  Psychomotor Activity:  Normal  Concentration:  Concentration: Fair and Attention Span: Fair  Recall:  [employer]  Fund of Knowledge:Fair  Language: Fair  Akathisia:  No  Handed:  Right  AIMS (if indicated):    Assets:  Communication Skills Desire for Improvement Physical Health Social Support Talents/Skills  ADL's:  Intact  Cognition: WNL  Sleep:  Poor     Treatment Plan Summary: Medication management   Discussed with patient what the medication options treatment risks benefits and alternatives.  Advised  patient to take Effexor on alternate days and stopped in one week to prevent withdrawal symptoms. Started on Cymbalta 30 mg daily and she agreed with the plan. Continue  trazodone 50 mg by mouth daily at bedtime She will continue with therapy appointments  Follow-up in 4 weeks or earlier depending on her symptoms   Cooper than 50% of the time spent in psychoeducation, counseling and coordination of care.    This note was generated in part or whole with voice recognition software. Voice regonition is usually quite accurate but there are transcription errors that can and very often do occur. I apologize for any typographical errors that were not detected and corrected.    Brandy HaleUzma Kendyn Zaman, MD 11/9/20174:12 PM

## 2016-08-03 ENCOUNTER — Ambulatory Visit: Payer: Managed Care, Other (non HMO) | Admitting: Psychiatry

## 2016-09-07 ENCOUNTER — Other Ambulatory Visit: Payer: Self-pay | Admitting: Psychiatry

## 2016-10-23 ENCOUNTER — Other Ambulatory Visit: Payer: Self-pay | Admitting: Psychiatry

## 2016-11-11 ENCOUNTER — Ambulatory Visit (INDEPENDENT_AMBULATORY_CARE_PROVIDER_SITE_OTHER): Payer: BC Managed Care – PPO | Admitting: Psychiatry

## 2016-11-11 ENCOUNTER — Encounter: Payer: Self-pay | Admitting: Psychiatry

## 2016-11-11 VITALS — BP 122/81 | HR 72 | Temp 98.7°F | Wt 270.6 lb

## 2016-11-11 DIAGNOSIS — F331 Major depressive disorder, recurrent, moderate: Secondary | ICD-10-CM | POA: Diagnosis not present

## 2016-11-11 DIAGNOSIS — F411 Generalized anxiety disorder: Secondary | ICD-10-CM | POA: Diagnosis not present

## 2016-11-11 MED ORDER — DULOXETINE HCL 60 MG PO CPEP: 60.0000 mg | ORAL_CAPSULE | Freq: Every day | ORAL | 1 refills | Status: DC

## 2016-11-11 MED ORDER — TRAZODONE HCL 50 MG PO TABS: 50.0000 mg | ORAL_TABLET | Freq: Every evening | ORAL | 1 refills | Status: DC | PRN

## 2016-11-11 NOTE — Progress Notes (Signed)
Psychiatric MD Progress NOTE  Patient Identification: Tonya Cooper MRN:  [id number] Date of Evaluation:  11/11/2016 Referral Source: Previous Pt of Dr Mayford Knife  Chief Complaint:   Chief Complaint    Follow-up; Medication Refill     Visit Diagnosis:    ICD-9-CM ICD-10-CM   1. Moderate episode of recurrent major depressive disorder (HCC) 296.32 F33.1   2. Generalized anxiety disorder 300.02 F41.1     History of Present Illness:    Patient is a 26 year old  female With history of depression presented for follow-up. She reported that She has been accepted into a child psychology and development program at the [facility] and will be started in her program in August. She was very excited and was talking in detail about her move to [place]. She reported that she is going there tonight with her boyfriend who will be moving from [place]. She reported that she is driving down there and will be looking for apartments. Patient reported that she has to move her stuff over there as well. She reported that she has been compliant with her medications and wants to go higher on the dose of the Cymbalta. Patient reported that Cymbalta is helping her and her mood is getting better. She is sleeping well with the help of trazodone. She reported that she is not having any side effects of the medication. She currently denied having any suicidal homicidal ideations or plans.  .   Associated Signs/Symptoms: Depression Symptoms:  anxiety, (Hypo) Manic Symptoms:  Flight of Ideas, Impulsivity, Labiality of Mood, Anxiety Symptoms:  Excessive Worry, Psychotic Symptoms:  none PTSD Symptoms: Negative NA  Past Psychiatric History: h/o depression and anxiety No h/o suicide attempts or hospitalization   Previous Psychotropic Medications:  Trazodone, Klonopin and Lexapro   Substance Abuse History in the last 12 months:  Yes.    Occasionally    Consequences of Substance  Abuse: Negative NA  Past Medical History:  Past Medical History:  Diagnosis Date  . Anxiety   . Depression    History reviewed. No pertinent surgical history.  Family Psychiatric History:  Anxiety - father  Family History:  Family History  Problem Relation Age of Onset  . Anemia Mother   . Heart attack Mother   . Skin cancer Father     Social History:   Social History   Social History  . Marital status: Single    Spouse name: N/A  . Number of children: N/A  . Years of education: N/A   Social History Main Topics  . Smoking status: Never Smoker  . Smokeless tobacco: Never Used  . Alcohol use 1.2 oz/week    1 Glasses of wine, 1 Cans of beer per week  . Drug use: No  . Sexual activity: Yes    Partners: Male    Birth control/ protection: Pill, Condom, IUD   Other Topics Concern  . None   Social History Narrative  . None    Additional Social History:  Planning to do Grad school next year  Currently teaching in school - three years.  Her father is upset since she came out and he is not  acceptable of her behavior. Has poor relationship with father - Has good relationship with mother and her relationship is improving with her brother. She stated that she has a relationship and her girlfriend travels a lot.       Allergies:  No Known Allergies  Metabolic Disorder Labs: No results found for: HGBA1C, MPG  No results found for: PROLACTIN No results found for: CHOL, TRIG, HDL, CHOLHDL, VLDL, LDLCALC   Current Medications: Current Outpatient Prescriptions  Medication Sig Dispense Refill  . DULoxetine (CYMBALTA) 60 MG capsule Take 1 capsule (60 mg total) by mouth daily. 90 capsule 1  . traZODone (DESYREL) 50 MG tablet Take 1 tablet (50 mg total) by mouth at bedtime as needed for sleep. 90 tablet 1   No current facility-administered medications for this visit.     Neurologic: Headache: No Seizure: No Paresthesias:No  Musculoskeletal: Strength &  Muscle Tone: within normal limits Gait & Station: normal Patient leans: N/A  Psychiatric Specialty Exam: ROS  Blood pressure 122/81, pulse 72, temperature 98.7 F (37.1 C), temperature source Oral, weight 270 lb 9.6 oz (122.7 kg).Body mass index is 37.74 kg/m.  General Appearance: Casual and Well Groomed  Eye Contact:  Fair  Speech:  Pressured  Volume:  Normal  Mood:  Anxious  Affect:  Congruent  Thought Process:  Coherent  Orientation:  Full (Time, Place, and Person)  Thought Content:  WDL  Suicidal Thoughts:  No  Homicidal Thoughts:  No  Memory:  Immediate;   Fair Recent;   Fair Remote;   Fair  Judgement:  Fair  Insight:  Fair  Psychomotor Activity:  Normal  Concentration:  Concentration: Fair and Attention Span: Fair  Recall:  [employer]Fair  Fund of Knowledge:Fair  Language: Fair  Akathisia:  No  Handed:  Right  AIMS (if indicated):    Assets:  Communication Skills Desire for Improvement Physical Health Social Support Talents/Skills  ADL's:  Intact  Cognition: WNL  Sleep:  Poor     Treatment Plan Summary: Medication management   Discussed with patient what the medication options treatment risks benefits and alternatives. . Started on Cymbalta 60 mg daily and she agreed with the plan. Continue  trazodone 50 mg by mouth daily at bedtime She will continue with therapy appointments  Follow-up in 2 months or earlier depending on her symptoms   More than 50% of the time spent in psychoeducation, counseling and coordination of care.    This note was generated in part or whole with voice recognition software. Voice regonition is usually quite accurate but there are transcription errors that can and very often do occur. I apologize for any typographical errors that were not detected and corrected.    Brandy HaleUzma Mancil Pfenning, MD 3/28/20189:58 AM

## 2017-01-04 ENCOUNTER — Ambulatory Visit (INDEPENDENT_AMBULATORY_CARE_PROVIDER_SITE_OTHER): Payer: BC Managed Care – PPO | Admitting: Psychiatry

## 2017-01-04 ENCOUNTER — Encounter: Payer: Self-pay | Admitting: Psychiatry

## 2017-01-04 VITALS — BP 148/92 | Temp 98.1°F | Wt 260.6 lb

## 2017-01-04 DIAGNOSIS — F411 Generalized anxiety disorder: Secondary | ICD-10-CM

## 2017-01-04 DIAGNOSIS — F330 Major depressive disorder, recurrent, mild: Secondary | ICD-10-CM | POA: Diagnosis not present

## 2017-01-04 MED ORDER — DULOXETINE HCL 60 MG PO CPEP: 60.0000 mg | ORAL_CAPSULE | Freq: Every day | ORAL | 1 refills | Status: AC

## 2017-01-04 MED ORDER — TRAZODONE HCL 50 MG PO TABS: 50.0000 mg | ORAL_TABLET | Freq: Every evening | ORAL | 1 refills | Status: AC | PRN

## 2017-01-04 NOTE — Progress Notes (Signed)
Psychiatric MD Progress NOTE  Patient Identification: Tonya Cooper MRN:  [id number] Date of Evaluation:  01/04/2017 Referral Source: Previous Pt of Dr Mayford KnifeWilliams  Chief Complaint:    Visit Diagnosis:    ICD-9-CM ICD-10-CM   1. MDD (major depressive disorder), recurrent episode, mild (HCC) 296.31 F33.0   2. Generalized anxiety disorder 300.02 F41.1     History of Present Illness:    Patient is a 26 year old  female With history of depression presented for follow-up. She reported that She has been accepted into a child psychology and development program at [facility] and will be starting  her program in August. She was very excited and was talking in detail about her move to [place].  She reported that she has been compliant with her medications Patient Reported that she is having some conflict with her mother who wants her to start packing and moving to [place]. Patient reported that she has to finish her school and has been planning to go to attend the [place] soon. She reported that her boyfriend has been helpful. They're planning to move in early August. Patient remains very anxious and apprehensive during the interview. She has been compliant with her medication. She is looking for a psychiatrist in [place]. I gave her reference.       Associated Signs/Symptoms: Depression Symptoms:  anxiety, (Hypo) Manic Symptoms:  Flight of Ideas, Impulsivity, Labiality of Mood, Anxiety Symptoms:  Excessive Worry, Psychotic Symptoms:  none PTSD Symptoms: Negative NA  Past Psychiatric History: h/o depression and anxiety No h/o suicide attempts or hospitalization   Previous Psychotropic Medications:  Trazodone, Klonopin and Lexapro   Substance Abuse History in the last 12 months:  Yes.    Occasionally    Consequences of Substance Abuse: Negative NA  Past Medical History:  Past Medical History:  Diagnosis Date  . Anxiety   .  Depression    No past surgical history on file.  Family Psychiatric History:  Anxiety - father  Family History:  Family History  Problem Relation Age of Onset  . Anemia Mother   . Heart attack Mother   . Skin cancer Father     Social History:   Social History   Social History  . Marital status: Single    Spouse name: N/A  . Number of children: N/A  . Years of education: N/A   Social History Main Topics  . Smoking status: Never Smoker  . Smokeless tobacco: Never Used  . Alcohol use 1.2 oz/week    1 Glasses of wine, 1 Cans of beer per week  . Drug use: No  . Sexual activity: Yes    Partners: Male    Birth control/ protection: Pill, Condom, IUD   Other Topics Concern  . Not on file   Social History Narrative  . 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 Has poor relationship with father - Has good relationship with mother and her relationship is improving with her brother. She stated that she has a relationship and her girlfriend travels a lot.       Allergies:  No Known Allergies  Metabolic Disorder Labs: No results found for: HGBA1C, MPG No results found for: PROLACTIN No results found for: CHOL, TRIG, HDL, CHOLHDL, VLDL, LDLCALC   Current  Medications: Current Outpatient Prescriptions  Medication Sig Dispense Refill  . DULoxetine (CYMBALTA) 60 MG capsule Take 1 capsule (60 mg total) by mouth daily. 90 capsule 1  . traZODone (DESYREL) 50 MG tablet Take 1 tablet (50 mg total) by mouth at bedtime as needed for sleep. 90 tablet 1   No current facility-administered medications for this visit.     Neurologic: Headache: No Seizure: No Paresthesias:No  Musculoskeletal: Strength & Muscle Tone: within normal limits Gait & Station: normal Patient leans: N/A  Psychiatric  Specialty Exam: ROS  There were no vitals taken for this visit.There is no height or weight on file to calculate BMI.  General Appearance: Casual and Well Groomed  Eye Contact:  Fair  Speech:  Clear and Coherent  Volume:  Normal  Mood:  Anxious  Affect:  Congruent  Thought Process:  Coherent  Orientation:  Full (Time, Place, and Person)  Thought Content:  WDL  Suicidal Thoughts:  No  Homicidal Thoughts:  No  Memory:  Immediate;   Fair Recent;   Fair Remote;   Fair  Judgement:  Fair  Insight:  Fair  Psychomotor Activity:  Normal  Concentration:  Concentration: Fair and Attention Span: Fair  Recall:  [employer] of Knowledge:Fair  Language: Fair  Akathisia:  No  Handed:  Right  AIMS (if indicated):    Assets:  Communication Skills Desire for Improvement Physical Health Social Support Talents/Skills  ADL's:  Intact  Cognition: WNL  Sleep:  Poor     Treatment Plan Summary: Medication management   Discussed with patient what the medication options treatment risks benefits and alternatives. . Started on Cymbalta 60 mg daily and she agreed with the plan. Continue  trazodone 50 mg by mouth daily at bedtime  She is moving to [facility]   This note was generated in part or whole with voice recognition software. Voice regonition is usually quite accurate but there are transcription errors that can and very often do occur. I apologize for any typographical errors that were not detected and corrected.    Brandy Hale, MD 5/21/20184:10 PM

## 2018-01-29 ENCOUNTER — Other Ambulatory Visit: Payer: Self-pay | Admitting: Psychiatry
